# Patient Record
Sex: Male | Born: 1985 | Race: White | Hispanic: No | Marital: Single | Smoking: Current every day smoker
Health system: Southern US, Community
[De-identification: ages and names within clinical notes are randomized; demographics above are authoritative.]

## PROBLEM LIST (undated history)

## (undated) HISTORY — PX: OTHER SURGICAL HISTORY: SHX169

## (undated) HISTORY — PX: HIP PINNING: SHX1757

---
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
## 2007-06-22 ENCOUNTER — Emergency Department (HOSPITAL_COMMUNITY): Admission: EM | Admit: 2007-06-22 | Discharge: 2007-06-22 | Payer: Self-pay | Admitting: Emergency Medicine

## 2008-05-13 ENCOUNTER — Emergency Department: Payer: Self-pay | Admitting: Emergency Medicine

## 2011-01-16 ENCOUNTER — Emergency Department (HOSPITAL_COMMUNITY)
Admission: EM | Admit: 2011-01-16 | Discharge: 2011-01-17 | Disposition: A | Discharge status: Home / Self Care | Payer: Self-pay

## 2011-02-28 ENCOUNTER — Encounter (HOSPITAL_COMMUNITY): Payer: Self-pay | Admitting: *Deleted

## 2011-02-28 ENCOUNTER — Emergency Department (HOSPITAL_COMMUNITY)
Admission: EM | Admit: 2011-02-28 | Discharge: 2011-03-01 | Disposition: A | Discharge status: Home / Self Care | Payer: Self-pay | Attending: Emergency Medicine | Admitting: Emergency Medicine

## 2011-02-28 DIAGNOSIS — R319 Hematuria, unspecified: Secondary | ICD-10-CM | POA: Insufficient documentation

## 2011-02-28 DIAGNOSIS — R300 Dysuria: Secondary | ICD-10-CM | POA: Insufficient documentation

## 2011-02-28 LAB — URINE MICROSCOPIC-ADD ON

## 2011-02-28 LAB — URINALYSIS, ROUTINE W REFLEX MICROSCOPIC
Glucose, UA: NEGATIVE mg/dL
Ketones, ur: 15 mg/dL — AB
pH: 6 (ref 5.0–8.0)

## 2011-02-28 NOTE — ED Provider Notes (Signed)
History     CSN: [id number]  Arrival date & time 02/28/11  2059   First MD Initiated Contact with Patient 02/28/11 2301      Chief Complaint  Patient presents with  . Hematuria    (Consider location/radiation/quality/duration/timing/severity/associated sxs/prior treatment) HPI Comments: Pt presents with cc of hematuria that began today at 8:00 PM. Pt states he has had 3 episodes total and that his urine has been cola colored as well. Pt reports bright red urination the first episode. "I squeezed my penis after i peed and blood continued to leak."  He reports dysuria, but denies frequency, urgency, tenderness and penile discharge. Pt states he is sexually active and uses condoms. Pt denies recent hx of URI or other illness.   Patient is a 26 y.o. male presenting with hematuria. The history is provided by the patient.  Hematuria This is a new problem. The current episode started today (at 8:00PM). The problem has been gradually improving since onset. He describes the hematuria as gross hematuria. The hematuria occurs throughout @his @ entire urinary stream.  He reports no clotting in his urine stream. His pain is at a severity of 6/10 (while urinating, no pain otherwise). Urine color: dark brown, cola colored. Irritative symptoms do not include frequency, nocturia or urgency. Obstructive symptoms do not include dribbling, incomplete emptying, an intermittent stream, a slower stream, straining or a weak stream. Pertinent negatives include no abdominal pain, chills, dysuria, facial swelling, fever, flank pain, genital pain, hesitancy, inability to urinate, nausea or vomiting. He is sexually active (use condoms). His past medical history is significant for tobacco use. There is no history of BPH, GU trauma, hypertension, kidney stones, prostatitis, recent infection, sickle cell disease or STDs.    History reviewed. No pertinent past medical history.  History reviewed. No pertinent past surgical  history.  History reviewed. No pertinent family history.  History  Substance Use Topics  . Smoking status: Current Everyday Smoker -- 1.5 packs/day for 15 years    Types: Cigarettes  . Smokeless tobacco: Not on file  . Alcohol Use: Yes      Review of Systems  Constitutional: Negative for fever and chills.  HENT: Negative for facial swelling.   Gastrointestinal: Negative for nausea, vomiting and abdominal pain.  Genitourinary: Positive for hematuria. Negative for dysuria, hesitancy, urgency, frequency, flank pain, incomplete emptying and nocturia.  All other systems reviewed and are negative.    Allergies  Review of patient's allergies indicates no known allergies.  Home Medications  No current outpatient prescriptions on file.  BP 133/91  Pulse 94  Temp(Src) 98.6 F (37 C) (Oral)  Resp 18  SpO2 99%  Physical Exam  Constitutional: He is oriented to person, place, and time. He appears well-developed and well-nourished. No distress.  HENT:  Head: Normocephalic and atraumatic.  Mouth/Throat: Oropharynx is clear and moist. No oropharyngeal exudate.  Eyes: Conjunctivae and EOM are normal. Pupils are equal, round, and reactive to light. No scleral icterus.  Neck: Normal range of motion. Neck supple. No tracheal deviation present. No thyromegaly present.  Cardiovascular: Normal rate, regular rhythm, normal heart sounds and intact distal pulses.   Pulmonary/Chest: Effort normal and breath sounds normal. No stridor.  Abdominal: Soft. Normal appearance and bowel sounds are normal. There is no tenderness.  Genitourinary: Testes normal and penis normal. Cremasteric reflex is present. Right testis shows no mass and no tenderness. Left testis shows no mass and no tenderness. Circumcised. No penile erythema. No discharge found.  Musculoskeletal: Normal  range of motion. He exhibits no edema and no tenderness.  Neurological: He is alert and oriented to person, place, and time. He has  normal reflexes. Coordination normal.  Skin: Skin is warm and dry. No rash noted. He is not diaphoretic. No erythema. No pallor.  Psychiatric: He has a normal mood and affect. His behavior is normal.    ED Course  Procedures (including critical care time)  Labs Reviewed  URINALYSIS, ROUTINE W REFLEX MICROSCOPIC - Abnormal; Notable for the following:    Color, Urine AMBER (*) BIOCHEMICALS MAY BE AFFECTED BY COLOR   APPearance CLOUDY (*)    Hgb urine dipstick MODERATE (*)    Bilirubin Urine SMALL (*)    Ketones, ur 15 (*)    Protein, ur 30 (*)    All other components within normal limits  URINE MICROSCOPIC-ADD ON - Abnormal; Notable for the following:    Squamous Epithelial / LPF FEW (*)    All other components within normal limits  CBC  DIFFERENTIAL  POCT I-STAT, CHEM 8  I-STAT, CHEM 8  GC/CHLAMYDIA PROBE AMP, GENITAL   No results found.   No diagnosis found.    MDM  Hematuria   Patient is currently hemodynamically stable, in no acute distress, and denies having any pain.  Patient verbalizes understanding that he needs to followup with hematology and that GC/chlamydia cultures are pending. Spoke with radiology and he states there are no acute abnormalities or hydronephrosis seen with in the kidneys. He recommends f-u w urology & possible cystoscopy. In addition pt will be dc on a trial of cipro.       Jaci Carrel, PA-C 03/01/11 0100  [place], PA-C 03/01/11 0101

## 2011-02-28 NOTE — ED Notes (Signed)
Pt reports that when he urinated tonight it was bright red.  Denies pain.  Also reports (L) should pain from previous injury.  No deformity noted.

## 2011-03-01 ENCOUNTER — Emergency Department (HOSPITAL_COMMUNITY): Payer: Self-pay

## 2011-03-01 LAB — CBC
HCT: 45 % (ref 39.0–52.0)
Hemoglobin: 16 g/dL (ref 13.0–17.0)
MCV: 90.4 fL (ref 78.0–100.0)
RBC: 4.98 MIL/uL (ref 4.22–5.81)
WBC: 9.8 10*3/uL (ref 4.0–10.5)

## 2011-03-01 LAB — POCT I-STAT, CHEM 8
Chloride: 100 mEq/L (ref 96–112)
Creatinine, Ser: 1 mg/dL (ref 0.50–1.35)
Glucose, Bld: 88 mg/dL (ref 70–99)
HCT: 48 % (ref 39.0–52.0)
Potassium: 3.7 mEq/L (ref 3.5–5.1)
Sodium: 140 mEq/L (ref 135–145)

## 2011-03-01 LAB — DIFFERENTIAL
Eosinophils Relative: 4 % (ref 0–5)
Lymphocytes Relative: 22 % (ref 12–46)
Lymphs Abs: 2.1 10*3/uL (ref 0.7–4.0)
Monocytes Absolute: 0.9 10*3/uL (ref 0.1–1.0)

## 2011-03-01 MED ORDER — CIPROFLOXACIN HCL 250 MG PO TABS: 250.0000 mg | ORAL_TABLET | Freq: Two times a day (BID) | ORAL | Status: AC

## 2011-03-01 NOTE — ED Notes (Signed)
Pt denies any pain or questions upon discharge. 

## 2011-03-01 NOTE — ED Provider Notes (Signed)
Medical screening examination/treatment/procedure(s) were performed by non-physician practitioner and as supervising physician I was immediately available for consultation/collaboration.  Delaina Fetsch K Damara Klunder-Rasch, MD 03/01/11 0107 

## 2011-03-30 ENCOUNTER — Emergency Department: Payer: Self-pay | Admitting: Emergency Medicine

## 2011-05-17 ENCOUNTER — Emergency Department: Payer: Self-pay

## 2013-02-25 ENCOUNTER — Emergency Department: Payer: Self-pay | Admitting: Emergency Medicine

## 2013-08-21 ENCOUNTER — Emergency Department: Payer: Self-pay | Admitting: Emergency Medicine

## 2013-08-21 LAB — URINALYSIS, COMPLETE
BILIRUBIN, UR: NEGATIVE
Blood: NEGATIVE
Glucose,UR: NEGATIVE mg/dL (ref 0–75)
LEUKOCYTE ESTERASE: NEGATIVE
NITRITE: NEGATIVE
PH: 5 (ref 4.5–8.0)
RBC,UR: 1 /HPF (ref 0–5)
SPECIFIC GRAVITY: 1.03 (ref 1.003–1.030)
WBC UR: 3 /HPF (ref 0–5)

## 2013-08-21 LAB — COMPREHENSIVE METABOLIC PANEL
ALBUMIN: 5.4 g/dL — AB (ref 3.4–5.0)
ALK PHOS: 87 U/L
ANION GAP: 9 (ref 7–16)
BUN: 32 mg/dL — ABNORMAL HIGH (ref 7–18)
Bilirubin,Total: 1.2 mg/dL — ABNORMAL HIGH (ref 0.2–1.0)
CALCIUM: 10.3 mg/dL — AB (ref 8.5–10.1)
CHLORIDE: 95 mmol/L — AB (ref 98–107)
Co2: 27 mmol/L (ref 21–32)
Creatinine: 1.47 mg/dL — ABNORMAL HIGH (ref 0.60–1.30)
EGFR (African American): >60
EGFR (Non-African Amer.): >60
Glucose: 100 mg/dL — ABNORMAL HIGH (ref 65–99)
Osmolality: 270 (ref 275–301)
Potassium: 3.7 mmol/L (ref 3.5–5.1)
SGOT(AST): 18 U/L (ref 15–37)
SGPT (ALT): 20 U/L (ref 12–78)
Sodium: 131 mmol/L — ABNORMAL LOW (ref 136–145)
TOTAL PROTEIN: 8.8 g/dL — AB (ref 6.4–8.2)

## 2013-08-21 LAB — DRUG SCREEN, URINE
AMPHETAMINES, UR SCREEN: NEGATIVE (ref ?–1000)
BARBITURATES, UR SCREEN: NEGATIVE (ref ?–200)
BENZODIAZEPINE, UR SCRN: NEGATIVE (ref ?–200)
Cannabinoid 50 Ng, Ur ~~LOC~~: POSITIVE (ref ?–50)
Cocaine Metabolite,Ur ~~LOC~~: NEGATIVE (ref ?–300)
MDMA (Ecstasy)Ur Screen: NEGATIVE (ref ?–500)
Methadone, Ur Screen: NEGATIVE (ref ?–300)
Opiate, Ur Screen: NEGATIVE (ref ?–300)
PHENCYCLIDINE (PCP) UR S: NEGATIVE (ref ?–25)
Tricyclic, Ur Screen: NEGATIVE (ref ?–1000)

## 2013-08-21 LAB — CK: CK, TOTAL: 355 U/L — AB

## 2013-08-21 LAB — CBC
HCT: 51.6 % (ref 40.0–52.0)
HGB: 17.6 g/dL (ref 13.0–18.0)
MCH: 32.3 pg (ref 26.0–34.0)
MCHC: 34 g/dL (ref 32.0–36.0)
MCV: 95 fL (ref 80–100)
PLATELETS: 214 10*3/uL (ref 150–440)
RBC: 5.43 10*6/uL (ref 4.40–5.90)
RDW: 13.5 % (ref 11.5–14.5)
WBC: 11.3 10*3/uL — AB (ref 3.8–10.6)

## 2013-08-21 LAB — LIPASE, BLOOD: Lipase: 74 U/L (ref 73–393)

## 2013-08-21 LAB — PROTIME-INR
INR: 1
PROTHROMBIN TIME: 13.4 s (ref 11.5–14.7)

## 2013-08-21 LAB — TROPONIN I

## 2018-05-27 ENCOUNTER — Emergency Department (HOSPITAL_COMMUNITY)
Admission: EM | Admit: 2018-05-27 | Discharge: 2018-05-27 | Disposition: A | Discharge status: Home / Self Care | Payer: Self-pay | Attending: Emergency Medicine | Admitting: Emergency Medicine

## 2018-05-27 ENCOUNTER — Other Ambulatory Visit: Payer: Self-pay

## 2018-05-27 DIAGNOSIS — F17210 Nicotine dependence, cigarettes, uncomplicated: Secondary | ICD-10-CM | POA: Insufficient documentation

## 2018-05-27 DIAGNOSIS — S61512A Laceration without foreign body of left wrist, initial encounter: Secondary | ICD-10-CM | POA: Insufficient documentation

## 2018-05-27 DIAGNOSIS — Y93H9 Activity, other involving exterior property and land maintenance, building and construction: Secondary | ICD-10-CM | POA: Insufficient documentation

## 2018-05-27 DIAGNOSIS — W208XXA Other cause of strike by thrown, projected or falling object, initial encounter: Secondary | ICD-10-CM | POA: Insufficient documentation

## 2018-05-27 DIAGNOSIS — Y999 Unspecified external cause status: Secondary | ICD-10-CM | POA: Insufficient documentation

## 2018-05-27 DIAGNOSIS — Z23 Encounter for immunization: Secondary | ICD-10-CM | POA: Insufficient documentation

## 2018-05-27 DIAGNOSIS — Y929 Unspecified place or not applicable: Secondary | ICD-10-CM | POA: Insufficient documentation

## 2018-05-27 MED ORDER — TETANUS-DIPHTH-ACELL PERTUSSIS 5-2.5-18.5 LF-MCG/0.5 IM SUSP
0.5000 mL | Freq: Once | INTRAMUSCULAR | Status: AC
  Administered 2018-05-27: 0.5 mL via INTRAMUSCULAR
  Filled 2018-05-27: qty 0.5

## 2018-05-27 NOTE — ED Provider Notes (Signed)
[facility] EMERGENCY DEPARTMENT Provider Note   CSN: [id number] Arrival date & time: 05/27/18  1537    History   Chief Complaint Chief Complaint  Patient presents with  . Wrist Laceration    HPI Ricardo Rice is a 33 y.o. male.     HPI 33 year old man without significant past medical history presents to the emergency department 30 minutes after he accidentally cut his left wrist when working on a window.  He states that he was holding up the window when it fell and hit a nearby knife that hit his wrist.  Patient has no thoughts of harming himself nor was this an intent to harm himself.  Patient not suicidal and otherwise has been feeling well.  Denies any other injuries.  Does not remember his last tetanus update.  Does not take any regular medication.  States that it bled profusely initially but it is since become hemostatic.  Still has full use of his hand and denies any numbness or tingling of the fingers. No past medical history on file.  There are no active problems to display for this patient.   No past surgical history on file.      Home Medications    Prior to Admission medications   Not on File    Family History No family history on file.  Social History Social History   Tobacco Use  . Smoking status: Current Every Day Smoker    Packs/day: 1.50    Years: 15.00    Pack years: 22.50    Types: Cigarettes  Substance Use Topics  . Alcohol use: Yes  . Drug use: Yes    Frequency: 2.0 times per week    Types: Marijuana     Allergies   Patient has no known allergies.   Review of Systems Review of Systems  Constitutional: Negative for fever.  HENT: Negative for dental problem.   Respiratory: Negative for shortness of breath.   Cardiovascular: Negative for chest pain.  Gastrointestinal: Negative for abdominal pain.  Musculoskeletal: Negative for arthralgias.  Skin: Positive for wound.  Allergic/Immunologic: Negative for  immunocompromised state.  Neurological: Negative for weakness and numbness.  Hematological: Does not bruise/bleed easily.  Psychiatric/Behavioral: Negative for self-injury and suicidal ideas.     Physical Exam Updated Vital Signs There were no vitals taken for this visit.  Physical Exam Vitals signs and nursing note reviewed.  Constitutional:      Appearance: He is well-developed.  HENT:     Head: Normocephalic and atraumatic.  Eyes:     Conjunctiva/sclera: Conjunctivae normal.  Neck:     Musculoskeletal: Neck supple.  Cardiovascular:     Rate and Rhythm: Normal rate and regular rhythm.     Heart sounds: No murmur.  Pulmonary:     Effort: Pulmonary effort is normal. No respiratory distress.     Breath sounds: Normal breath sounds.  Abdominal:     Palpations: Abdomen is soft.     Tenderness: There is no abdominal tenderness.  Musculoskeletal:     Comments: 1 cm horizontal laceration overlying the left wrist radial artery superficial.  No arterial tissue able to be found on inspection.  No pulsating blood flow.  Capillary refill less than 2 seconds in bilateral fingers in all fingers.  5/5 strength in grip as well as finger abduction and extension.  Skin:    General: Skin is warm and dry.  Neurological:     Mental Status: He is alert.  Sensory: No sensory deficit.     Motor: No weakness.  Psychiatric:        Mood and Affect: Mood normal.      ED Treatments / Results  Labs (all labs ordered are listed, but only abnormal results are displayed) Labs Reviewed - No data to display  EKG None  Radiology No results found.  Procedures .Marland Kitchen.Laceration Repair Date/Time: 05/27/2018 4:13 PM Performed by: Ina KickWestphal, Mersadies Petree, MD Authorized by: Ina KickWestphal, Keesha Pellum, MD   Consent:    Consent obtained:  Verbal   Consent given by:  Patient Anesthesia (see MAR for exact dosages):    Anesthesia method:  None Laceration details:    Location:  Shoulder/arm   Shoulder/arm  location:  L lower arm   Length (cm):  1 Repair type:    Repair type:  Simple Exploration:    Wound exploration: wound explored through full range of motion and entire depth of wound probed and visualized     Wound extent: no foreign bodies/material noted, no muscle damage noted, no nerve damage noted, no tendon damage noted and no vascular damage noted     Contaminated: no   Treatment:    Area cleansed with:  Saline   Amount of cleaning:  Standard   Irrigation solution:  Sterile saline   Irrigation method:  Syringe Skin repair:    Repair method:  Tissue adhesive Approximation:    Approximation:  Close Post-procedure details:    Dressing:  Open (no dressing)   Patient tolerance of procedure:  Tolerated well, no immediate complications   (including critical care time)  Medications Ordered in ED Medications - No data to display   Initial Impression / Assessment and Plan / ED Course  I have reviewed the triage vital signs and the nursing notes.  Pertinent labs & imaging results that were available during my care of the patient were reviewed by me and considered in my medical decision making (see chart for details).         Well-appearing hemodynamically stable 33 year old man presents to the emergency department for a laceration as noted above.  Does not appear to be psychotic nor does this appear to be self-injurious behavior.  Story consistent with a laceration.  Superficial and I do not believe that imaging such as an x-ray is needed at this time as I can fully palpate and inspect the entirety of the wound.  Patient does not desire stitches at this time so Dermabond and Steri-Strips were used.  Tetanus updated.  No vascular injury identified and the patient has normal sensation, capillary refill and movement of the left hand so I doubt any kind of neurovascular injury at this time.  Given follow-up instructions for PCP for wound recheck if the patient desires.  Good wound  apposition with the closure. Final Clinical Impressions(s) / ED Diagnoses   Final diagnoses:  Laceration of left wrist, initial encounter    ED Discharge Orders    None       Ina KickWestphal, Keela Rubert, MD 05/27/18 1634    Melene PlanFloyd, Dan, DO 05/27/18 1637

## 2018-05-27 NOTE — ED Notes (Signed)
EDP applying steri-strips at bedside.

## 2018-05-27 NOTE — ED Triage Notes (Signed)
Pt was cleaning a window to reset the window & the knife he was holding slipped & cut his inner Lt wrist. approx 1/2 inch in length, edges are clean cut & no bleeding at this time.

## 2019-01-07 ENCOUNTER — Emergency Department (HOSPITAL_COMMUNITY): Payer: Self-pay

## 2019-01-07 ENCOUNTER — Emergency Department (HOSPITAL_COMMUNITY)
Admission: EM | Admit: 2019-01-07 | Discharge: 2019-01-07 | Disposition: A | Discharge status: Home / Self Care | Payer: Self-pay | Attending: Emergency Medicine | Admitting: Emergency Medicine

## 2019-01-07 ENCOUNTER — Encounter (HOSPITAL_COMMUNITY): Payer: Self-pay | Admitting: Emergency Medicine

## 2019-01-07 DIAGNOSIS — L03314 Cellulitis of groin: Secondary | ICD-10-CM | POA: Insufficient documentation

## 2019-01-07 DIAGNOSIS — F17210 Nicotine dependence, cigarettes, uncomplicated: Secondary | ICD-10-CM | POA: Insufficient documentation

## 2019-01-07 LAB — BASIC METABOLIC PANEL
Anion gap: 10 (ref 5–15)
BUN: 9 mg/dL (ref 6–20)
CO2: 26 mmol/L (ref 22–32)
Calcium: 9.3 mg/dL (ref 8.9–10.3)
Chloride: 101 mmol/L (ref 98–111)
Creatinine, Ser: 0.86 mg/dL (ref 0.61–1.24)
GFR calc Af Amer: >60 mL/min (ref >60)
GFR calc non Af Amer: >60 mL/min (ref >60)
Glucose, Bld: 109 mg/dL — ABNORMAL HIGH (ref 70–99)
Potassium: 3.8 mmol/L (ref 3.5–5.1)
Sodium: 137 mmol/L (ref 135–145)

## 2019-01-07 LAB — CBC WITH DIFFERENTIAL/PLATELET
Abs Immature Granulocytes: 0.04 10*3/uL (ref 0.00–0.07)
Basophils Absolute: 0 10*3/uL (ref 0.0–0.1)
Basophils Relative: 0 %
Eosinophils Absolute: 0.2 10*3/uL (ref 0.0–0.5)
Eosinophils Relative: 1 %
HCT: 45 % (ref 39.0–52.0)
Hemoglobin: 15.4 g/dL (ref 13.0–17.0)
Immature Granulocytes: 0 %
Lymphocytes Relative: 13 %
Lymphs Abs: 1.8 10*3/uL (ref 0.7–4.0)
MCH: 31.8 pg (ref 26.0–34.0)
MCHC: 34.2 g/dL (ref 30.0–36.0)
MCV: 92.8 fL (ref 80.0–100.0)
Monocytes Absolute: 0.9 10*3/uL (ref 0.1–1.0)
Monocytes Relative: 6 %
Neutro Abs: 11.2 10*3/uL — ABNORMAL HIGH (ref 1.7–7.7)
Neutrophils Relative %: 80 %
Platelets: 228 10*3/uL (ref 150–400)
RBC: 4.85 MIL/uL (ref 4.22–5.81)
RDW: 12.3 % (ref 11.5–15.5)
WBC: 14.1 10*3/uL — ABNORMAL HIGH (ref 4.0–10.5)
nRBC: 0 % (ref 0.0–0.2)

## 2019-01-07 MED ORDER — SULFAMETHOXAZOLE-TRIMETHOPRIM 800-160 MG PO TABS: 1.0000 | ORAL_TABLET | Freq: Two times a day (BID) | ORAL | 0 refills | Status: AC

## 2019-01-07 MED ORDER — LIDOCAINE-EPINEPHRINE (PF) 2 %-1:200000 IJ SOLN: 10.0000 mL | Freq: Once | INTRAMUSCULAR | Status: DC

## 2019-01-07 MED ORDER — HYDROMORPHONE HCL 1 MG/ML IJ SOLN
1.0000 mg | Freq: Once | INTRAMUSCULAR | Status: AC
  Administered 2019-01-07: 19:00:00 1 mg via INTRAVENOUS
  Filled 2019-01-07: qty 1

## 2019-01-07 MED ORDER — SODIUM CHLORIDE 0.9 % IV SOLN
INTRAVENOUS | Status: DC
  Administered 2019-01-07: 20:00:00 via INTRAVENOUS

## 2019-01-07 MED ORDER — CEPHALEXIN 500 MG PO CAPS: 500.0000 mg | ORAL_CAPSULE | Freq: Three times a day (TID) | ORAL | 0 refills | Status: AC

## 2019-01-07 MED ORDER — HYDROCODONE-ACETAMINOPHEN 5-325 MG PO TABS
1.0000 | ORAL_TABLET | Freq: Four times a day (QID) | ORAL | 0 refills | Status: AC | PRN
Start: 2019-01-07 — End: ?

## 2019-01-07 MED ORDER — KETOROLAC TROMETHAMINE 15 MG/ML IJ SOLN
15.0000 mg | Freq: Once | INTRAMUSCULAR | Status: AC
  Administered 2019-01-07: 19:00:00 15 mg via INTRAVENOUS
  Filled 2019-01-07: qty 1

## 2019-01-07 MED ORDER — VANCOMYCIN HCL 10 G IV SOLR
1500.0000 mg | Freq: Once | INTRAVENOUS | Status: AC
  Administered 2019-01-07: 20:00:00 1500 mg via INTRAVENOUS
  Filled 2019-01-07: qty 1500

## 2019-01-07 MED ORDER — IOHEXOL 300 MG/ML  SOLN
100.0000 mL | Freq: Once | INTRAMUSCULAR | Status: AC | PRN
  Administered 2019-01-07: 20:00:00 100 mL via INTRAVENOUS

## 2019-01-07 MED ORDER — LIDOCAINE-EPINEPHRINE 2 %-1:100000 IJ SOLN: 10.0000 mL | Freq: Once | INTRAMUSCULAR | Status: DC

## 2019-01-07 NOTE — Discharge Instructions (Signed)
You have cellulitis at this point. There is not a well organized collection (abscess) to drain currently. Take antibiotics as prescribed. I want you to use warm compresses or sitz baths 2-3 times/day. Do not pick or probe at the area. If you feel like symptoms are worsening or not starting to get better in the next 48 hours then I want you to be rechecked. Take ibuprofen 600 mg every 6 hours as needed for pain. Use vicodin as needed for break through pain.

## 2019-01-07 NOTE — ED Notes (Signed)
Pt was in hurry and wouldn't let me get vs

## 2019-01-07 NOTE — ED Notes (Signed)
Patient transported to CT 

## 2019-01-07 NOTE — ED Triage Notes (Signed)
Pt states he has an ingrown hair in right groin that he "cut out last week" but it came back.

## 2019-01-11 NOTE — ED Provider Notes (Signed)
[facility] EMERGENCY DEPARTMENT Provider Note   CSN: [id number] Arrival date & time: 01/07/19  1541     History   Chief Complaint Chief Complaint  Patient presents with  . Recurrent Skin Infections    HPI Ricardo Rice is a 33 y.o. male.     HPI   33 year old male with painful lesion in his right groin.  Patient reports a "ingrown hair" that he "cut out" in the last week.  It seemed to improve initially but then he began having worsening pain and swelling over the past day or so.  No drainage.  No fevers or chills.  History reviewed. No pertinent past medical history.  There are no active problems to display for this patient.   No past surgical history on file.      Home Medications    Prior to Admission medications   Medication Sig Start Date End Date Taking? Authorizing Provider  cephALEXin (KEFLEX) 500 MG capsule Take 1 capsule (500 mg total) by mouth 3 (three) times daily. 01/07/19   Raeford Razor, MD  HYDROcodone-acetaminophen (NORCO/VICODIN) 5-325 MG tablet Take 1 tablet by mouth every 6 (six) hours as needed. 01/07/19   Raeford Razor, MD  sulfamethoxazole-trimethoprim (BACTRIM DS) 800-160 MG tablet Take 1 tablet by mouth 2 (two) times daily for 7 days. 01/07/19 01/14/19  Raeford Razor, MD    Family History No family history on file.  Social History Social History   Tobacco Use  . Smoking status: Current Every Day Smoker    Packs/day: 1.50    Years: 15.00    Pack years: 22.50    Types: Cigarettes  Substance Use Topics  . Alcohol use: Yes  . Drug use: Yes    Frequency: 2.0 times per week    Types: Marijuana     Allergies   Patient has no known allergies.   Review of Systems Review of Systems  All systems reviewed and negative, other than as noted in HPI.  Physical Exam Updated Vital Signs BP 128/85   Pulse (!) 121   Temp 99.6 F (37.6 C) (Oral)   Resp 18   Wt 68 kg   SpO2 99%   BMI 22.81 kg/m   Physical  Exam Vitals signs and nursing note reviewed.  Constitutional:      General: He is not in acute distress.    Appearance: He is well-developed.  HENT:     Head: Normocephalic and atraumatic.  Eyes:     General:        Right eye: No discharge.        Left eye: No discharge.     Conjunctiva/sclera: Conjunctivae normal.  Neck:     Musculoskeletal: Neck supple.  Cardiovascular:     Rate and Rhythm: Normal rate and regular rhythm.     Heart sounds: Normal heart sounds. No murmur. No friction rub. No gallop.   Pulmonary:     Effort: Pulmonary effort is normal. No respiratory distress.     Breath sounds: Normal breath sounds.  Abdominal:     General: There is no distension.     Palpations: Abdomen is soft.     Tenderness: There is abdominal tenderness.     Comments: Tender, erythematous lesion noted near the right inguinal crease.  Very indurated.  No fluctuance appreciated.  Bedside ultrasound showed subcutaneous cobblestoning but no discrete collection noted.  Not extend into the scrotum/perineum.  Musculoskeletal:        General: No tenderness.  Skin:    General: Skin is warm and dry.  Neurological:     Mental Status: He is alert.  Psychiatric:        Behavior: Behavior normal.        Thought Content: Thought content normal.      ED Treatments / Results  Labs (all labs ordered are listed, but only abnormal results are displayed) Labs Reviewed  CBC WITH DIFFERENTIAL/PLATELET - Abnormal; Notable for the following components:      Result Value   WBC 14.1 (*)    Neutro Abs 11.2 (*)    All other components within normal limits  BASIC METABOLIC PANEL - Abnormal; Notable for the following components:   Glucose, Bld 109 (*)    All other components within normal limits    EKG None  Radiology No results found.  Procedures Procedures (including critical care time)  Medications Ordered in ED Medications  vancomycin (VANCOCIN) 1,500 mg in sodium chloride 0.9 % 500 mL IVPB  (0 mg Intravenous Stopped 01/07/19 2147)  HYDROmorphone (DILAUDID) injection 1 mg (1 mg Intravenous Given 01/07/19 1858)  ketorolac (TORADOL) 15 MG/ML injection 15 mg (15 mg Intravenous Given 01/07/19 1858)  iohexol (OMNIPAQUE) 300 MG/ML solution 100 mL (100 mLs Intravenous Contrast Given 01/07/19 1939)     Initial Impression / Assessment and Plan / ED Course  I have reviewed the triage vital signs and the nursing notes.  Pertinent labs & imaging results that were available during my care of the patient were reviewed by me and considered in my medical decision making (see chart for details).        33 year old male with what appears to be a cellulitis of the right groin at this time.  No discrete abscess to drain.  Sitz bath/warm compresses.  Antibiotics.  Return precautions were discussed.  Final Clinical Impressions(s) / ED Diagnoses   Final diagnoses:  Cellulitis of groin    ED Discharge Orders         Ordered    cephALEXin (KEFLEX) 500 MG capsule  3 times daily     01/07/19 2035    sulfamethoxazole-trimethoprim (BACTRIM DS) 800-160 MG tablet  2 times daily     01/07/19 2035    HYDROcodone-acetaminophen (NORCO/VICODIN) 5-325 MG tablet  Every 6 hours PRN     01/07/19 2036           Virgel Manifold, MD 01/11/19 2351

## 2021-04-29 ENCOUNTER — Emergency Department (HOSPITAL_COMMUNITY)
Admission: EM | Admit: 2021-04-29 | Discharge: 2021-04-29 | Disposition: A | Discharge status: Home / Self Care | Payer: Managed Care, Other (non HMO) | Attending: Emergency Medicine | Admitting: Emergency Medicine

## 2021-04-29 ENCOUNTER — Encounter (HOSPITAL_COMMUNITY): Payer: Self-pay | Admitting: *Deleted

## 2021-04-29 ENCOUNTER — Emergency Department (HOSPITAL_COMMUNITY): Payer: Managed Care, Other (non HMO)

## 2021-04-29 ENCOUNTER — Other Ambulatory Visit: Payer: Self-pay

## 2021-04-29 DIAGNOSIS — S61431A Puncture wound without foreign body of right hand, initial encounter: Secondary | ICD-10-CM | POA: Diagnosis not present

## 2021-04-29 DIAGNOSIS — S61031A Puncture wound without foreign body of right thumb without damage to nail, initial encounter: Secondary | ICD-10-CM | POA: Insufficient documentation

## 2021-04-29 DIAGNOSIS — W540XXA Bitten by dog, initial encounter: Secondary | ICD-10-CM | POA: Insufficient documentation

## 2021-04-29 DIAGNOSIS — Z203 Contact with and (suspected) exposure to rabies: Secondary | ICD-10-CM | POA: Insufficient documentation

## 2021-04-29 DIAGNOSIS — Y92009 Unspecified place in unspecified non-institutional (private) residence as the place of occurrence of the external cause: Secondary | ICD-10-CM | POA: Diagnosis not present

## 2021-04-29 DIAGNOSIS — S6991XA Unspecified injury of right wrist, hand and finger(s), initial encounter: Secondary | ICD-10-CM | POA: Diagnosis present

## 2021-04-29 DIAGNOSIS — S61531A Puncture wound without foreign body of right wrist, initial encounter: Secondary | ICD-10-CM | POA: Insufficient documentation

## 2021-04-29 MED ORDER — IBUPROFEN 800 MG PO TABS
800.0000 mg | ORAL_TABLET | Freq: Once | ORAL | Status: AC
  Administered 2021-04-29: 800 mg via ORAL
  Filled 2021-04-29: qty 1

## 2021-04-29 MED ORDER — FENTANYL CITRATE PF 50 MCG/ML IJ SOSY: 25.0000 ug | PREFILLED_SYRINGE | Freq: Once | INTRAMUSCULAR | Status: DC

## 2021-04-29 MED ORDER — AMOXICILLIN-POT CLAVULANATE 875-125 MG PO TABS
1.0000 | ORAL_TABLET | Freq: Once | ORAL | Status: AC
  Administered 2021-04-29: 1 via ORAL
  Filled 2021-04-29: qty 1

## 2021-04-29 MED ORDER — FENTANYL CITRATE PF 50 MCG/ML IJ SOSY
25.0000 ug | PREFILLED_SYRINGE | Freq: Once | INTRAMUSCULAR | Status: AC
  Administered 2021-04-29: 25 ug via INTRAVENOUS
  Filled 2021-04-29: qty 1

## 2021-04-29 MED ORDER — AMOXICILLIN-POT CLAVULANATE 875-125 MG PO TABS
1.0000 | ORAL_TABLET | Freq: Two times a day (BID) | ORAL | 0 refills | Status: AC
Start: 2021-04-29 — End: ?

## 2021-04-29 NOTE — ED Triage Notes (Signed)
Patient presents to [facility] states he was at home his 2 male pit bull dogs got into a fight and he was trying to break them up and one of his dogs bit his right hand. Per ems 4-5 puncture wounds to his right hand. Bleeding controled at present. Patient states dogs shots are up to date.  ?

## 2021-04-29 NOTE — ED Provider Notes (Signed)
?[facility] EMERGENCY DEPARTMENT ?Provider Note ? ? ?CSN: [id number] ?Arrival date & time: 04/29/21  1044 ? ?  ? ?History ? ?Chief Complaint  ?Patient presents with  ? Animal Bite  ? ? ?Ricardo Rice is a 36 y.o. male presenting today after dog bite.  Reports he was playing with his 2 pit bulls when one of them bit his right hand.  Says they are up-to-date on vaccinations that have been vaccinated for rabies.  Denies numbness or tingling but endorsing severe pain.  Is unsure when his last tetanus shot was.  No antibiotic allergies.  He reports full range of motion and that bleeding was easily controlled with pressure. ? ? ?Animal Bite ?Contact animal:  Dog ?Location:  Hand ?Hand injury location:  R hand ?Time since incident:  1 hour ?Pain details:  ?  Quality:  Unable to specify ?  Severity:  Severe ?  Timing:  Constant ?Incident location:  Home ?Provoked: provoked   ?Animal's rabies vaccination status:  Up to date ?Animal in possession: yes   ?Tetanus status:  Out of date ?Worsened by:  Activity ?Ineffective treatments:  None tried ?Associated symptoms: no numbness   ? ?  ? ?Home Medications ?Prior to Admission medications   ?Medication Sig Start Date End Date Taking? Authorizing Provider  ?amoxicillin-clavulanate (AUGMENTIN) 875-125 MG tablet Take 1 tablet by mouth every 12 (twelve) hours. 04/29/21  Yes Crystalmarie Yasin A, PA-C  ?cephALEXin (KEFLEX) 500 MG capsule Take 1 capsule (500 mg total) by mouth 3 (three) times daily. 01/07/19   Raeford Razor, MD  ?HYDROcodone-acetaminophen (NORCO/VICODIN) 5-325 MG tablet Take 1 tablet by mouth every 6 (six) hours as needed. 01/07/19   Raeford Razor, MD  ?   ? ?Allergies    ?Patient has no known allergies.   ? ?Review of Systems   ?Review of Systems  ?Neurological:  Negative for numbness.  ?See HPI ? ?Physical Exam ?Updated Vital Signs ?BP 127/79 (BP Location: Left Arm)   Pulse 64   Temp 98.5 ?F (36.9 ?C) (Oral)   Resp 17   Ht 5\' 5"  (1.651 m)   Wt  68 kg   SpO2 99%   BMI 24.96 kg/m?  ?Physical Exam ?Vitals and nursing note reviewed.  ?Constitutional:   ?   Appearance: Normal appearance.  ?HENT:  ?   Head: Normocephalic and atraumatic.  ?Eyes:  ?   General: No scleral icterus. ?   Conjunctiva/sclera: Conjunctivae normal.  ?Pulmonary:  ?   Effort: Pulmonary effort is normal. No respiratory distress.  ?Musculoskeletal:     ?   General: Signs of injury present. Normal range of motion.  ?   Comments: Full range of motion, no exposed muscles, tendons or vasculature.  Photo of wound in chart.  Puncture wounds to dorsal right hand and wrist  ?Skin: ?   Findings: No rash.  ?Neurological:  ?   Mental Status: He is alert.  ?Psychiatric:     ?   Mood and Affect: Mood normal.  ? ? ?ED Results / Procedures / Treatments   ?Labs ?(all labs ordered are listed, but only abnormal results are displayed) ?Labs Reviewed - No data to display ? ?EKG ?None ? ?Radiology ?DG Hand Complete Right ? ?Result Date: 04/29/2021 ?CLINICAL DATA:  Dog bite near base of thumb EXAM: RIGHT HAND - COMPLETE 3+ VIEW COMPARISON:  None. FINDINGS: There is no evidence of fracture or dislocation. There is no evidence of arthropathy or other focal bone abnormality.  No radiopaque foreign body. Soft tissue lacerations about the base of the thumb. IMPRESSION: No fracture or dislocation of the right hand. No radiopaque foreign body. Electronically Signed   By: Jearld Lesch M.D.   On: 04/29/2021 12:17   ? ?Procedures ?Procedures  ? ?Medications Ordered in ED ?Medications  ?fentaNYL (SUBLIMAZE) injection 25 mcg (has no administration in time range)  ? ? ?ED Course/ Medical Decision Making/ A&P ?Clinical Course as of 04/29/21 1159  ?Fri Apr 29, 2021  ?1155 This is a 36 year old male presenting emergency department with a dog bite to his right hand.  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  He reports that the dog belongs to a family member, the vaccines are up-to-date.  He  has several punctate lacerations to the dorsum of his right hand, involving the base of the thumb.  They are not actively bleeding.  He has full range of motion of all digits, including thumb opposition, and has no evidence of neurovascular damage on my exam.  Sensation is intact.  I do not see evidence of infection or abscess at this time.  He will need an x-ray to screen for retained foreign bodies, will need the wounds to be irrigated, I would leave the wounds open as they are punctate we do not want to seal them at this time.  He will need to be started on Augmentin as antibiotics.  Patient had refused tetanus here. [MT]  ?  ?Clinical Course User Index ?[MT] Terald Sleeper, MD  ? ?                        ?Medical Decision Making ?Amount and/or Complexity of Data Reviewed ?Radiology: ordered. ? ?Risk ?Prescription drug management. ? ? ?36 year old male presenting today with the complaint of a dog bite.  Dog's were his, up-to-date on vaccinations.  He does not know when his last tetanus shot was but refuses to have one in the department today. ? ?Patient was also seen by my attending, Dr. Renaye Rakers.  Radiograph was ordered and independently viewed by me.  No signs of bone involvement.  Radiologist states that there is also no foreign body. ? ?Patient is neurovascularly intact.  Full range of motion in wrist, all MTPs and phalanges.  He will be discharged home with wound care and antibiotics.  He continues to deny tetanus shot.  We discussed the risks of this and he says he understands.  Reports fentanyl helped his pain.  He will treat with over-the-counter medications at home. ? ?Final Clinical Impression(s) / ED Diagnoses ?Final diagnoses:  ?Dog bite, initial encounter  ? ? ?Rx / DC Orders ?ED Discharge Orders   ? ?      Ordered  ?  amoxicillin-clavulanate (AUGMENTIN) 875-125 MG tablet  Every 12 hours       ? 04/29/21 1118  ? ?  ?  ? ?  ? ?Results and diagnoses were explained to the patient. Return precautions  discussed in full. Patient had no additional questions and expressed complete understanding. ? ? ?This chart was dictated using voice recognition software.  Despite best efforts to proofread,  errors can occur which can change the documentation meaning.  ?  ?Saddie Benders, PA-C ?04/29/21 1242 ? ?  ?Terald Sleeper, MD ?04/29/21 1333 ? ?

## 2021-04-29 NOTE — Discharge Instructions (Addendum)
Please return if you begin to have fevers, chills or notice yellow/green discharge from the wound.  Be sure to keep it clean with mild soap and warm water and keep the area covered for the next few days.  You may use Neosporin on the areas as you wish. ?

## 2021-05-26 IMAGING — CT CT PELVIS W/ CM
2 of 3 series · 16 of 46 positions shown, 18 images · IV contrast (Omni 300)
Comparison: None.

CLINICAL DATA: Pelvic swelling on the right, history of recent
ingrown hair

EXAM:
CT PELVIS WITH CONTRAST
TECHNIQUE: Multidetector CT imaging of the pelvis was performed using the
standard protocol following the bolus administration of intravenous
contrast.
CONTRAST:  100mL OMNIPAQUE IOHEXOL 300 MG/ML  SOLN

[Series 3: pelvis with 5.0 · axial · 0.73mm/px · z∈[+707,+997]mm · 13 of 68 slices shown, 15 images]
[im 5/68  soft-tissue]
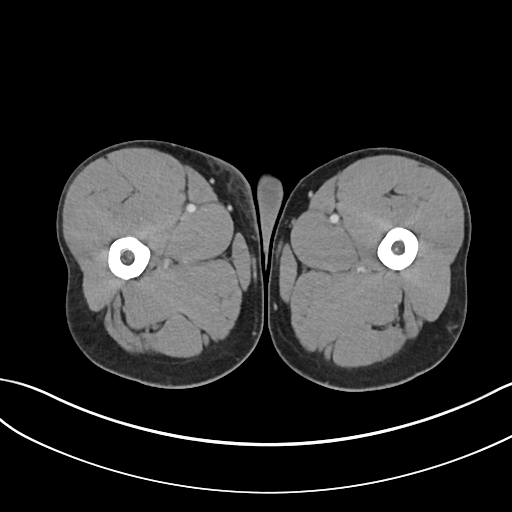
[im 5/68  bone]
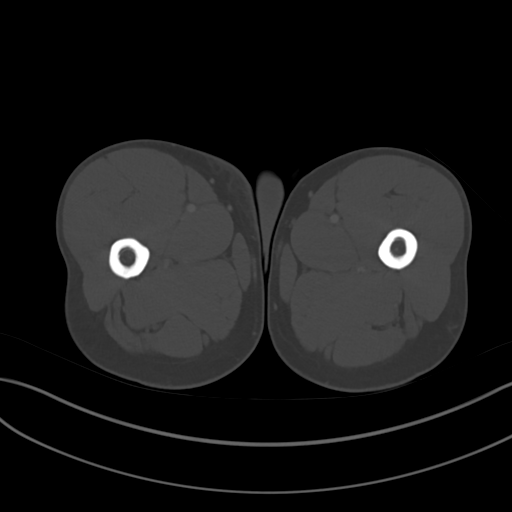
[im 9/68  soft-tissue]
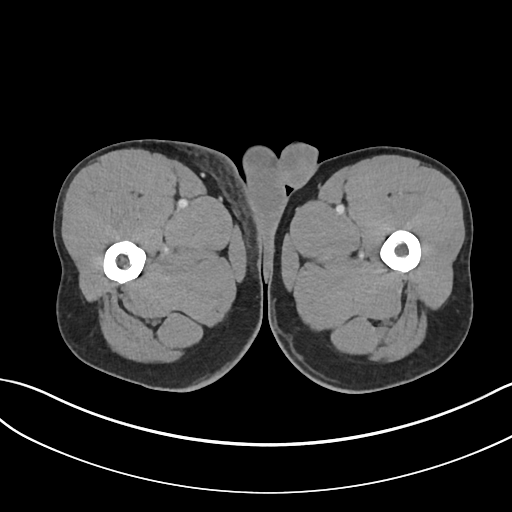
[im 13/68  soft-tissue]
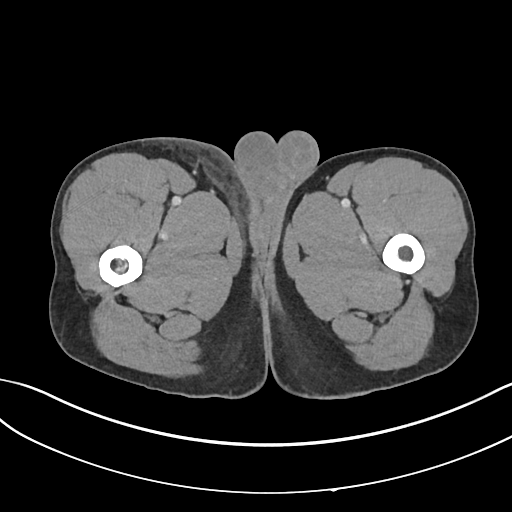
[im 20/68  soft-tissue]
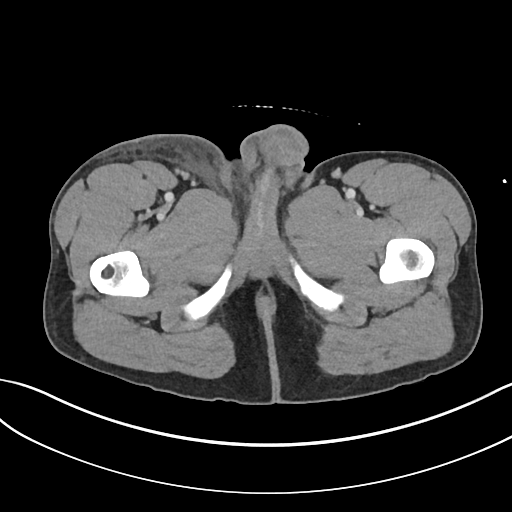
[im 24/68  soft-tissue]
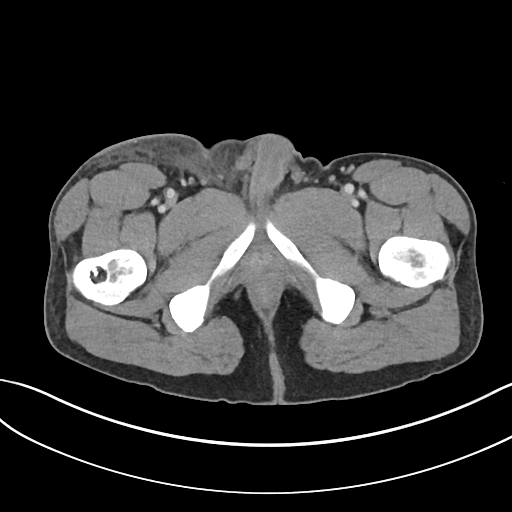
[im 29/68  soft-tissue]
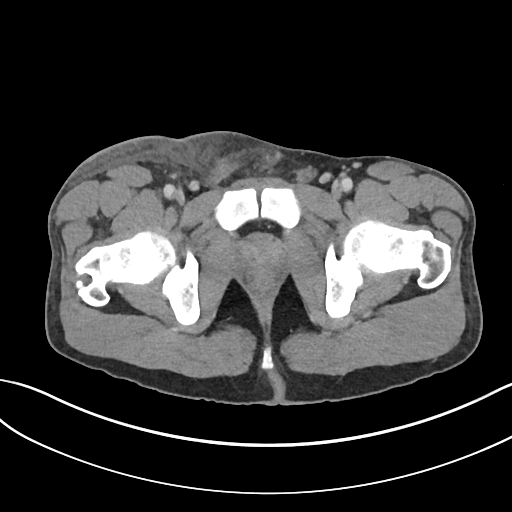
[im 35/68  soft-tissue]
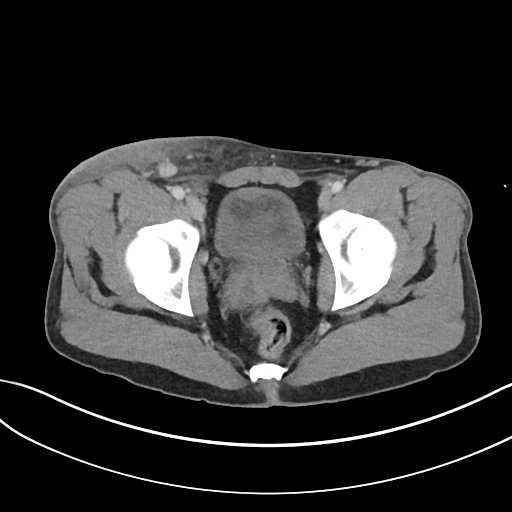
[im 39/68  soft-tissue]
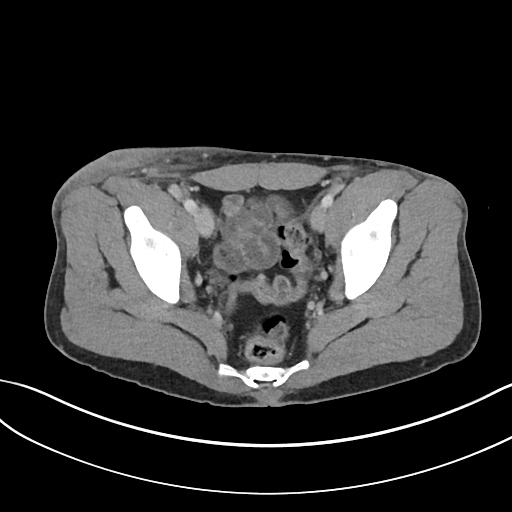
[im 44/68  soft-tissue]
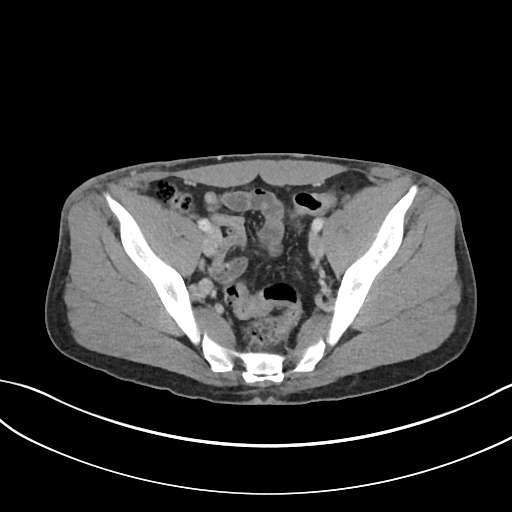
[im 44/68  bone]
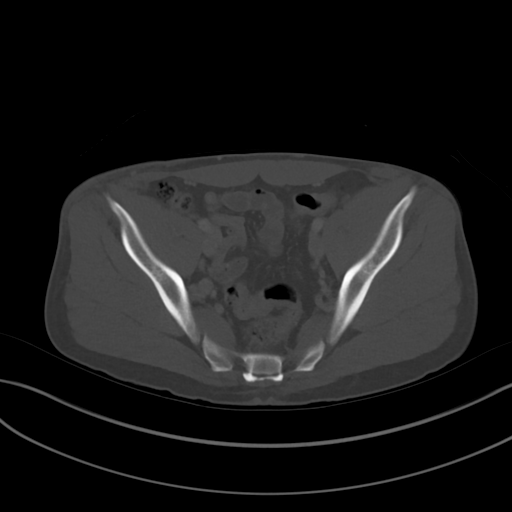
[im 48/68  soft-tissue]
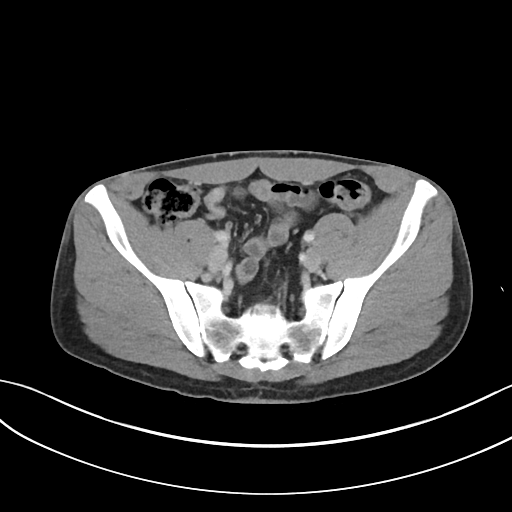
[im 55/68  soft-tissue]
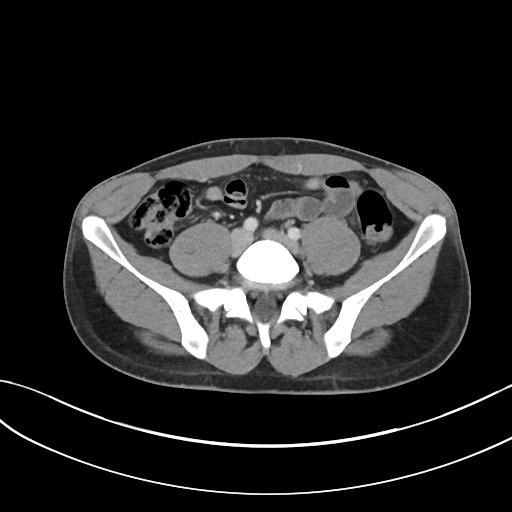
[im 59/68  soft-tissue]
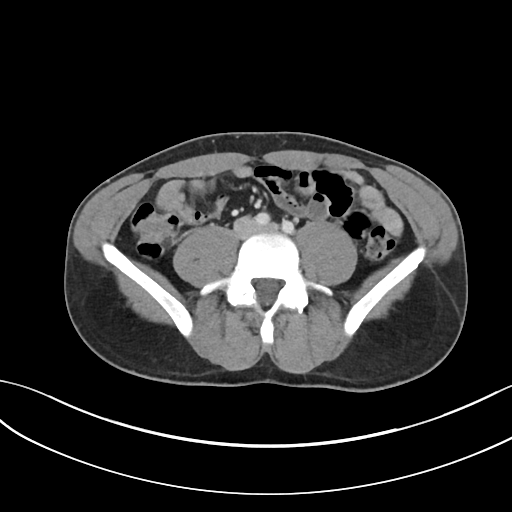
[im 63/68  soft-tissue]
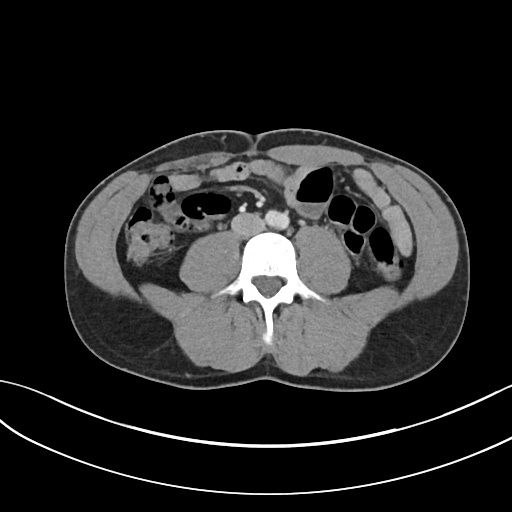

[Series 5: pelvis with 2.0 cor · coronal · 0.67mm/px · 3 of 121 slices shown]
[im 41/121  soft-tissue]
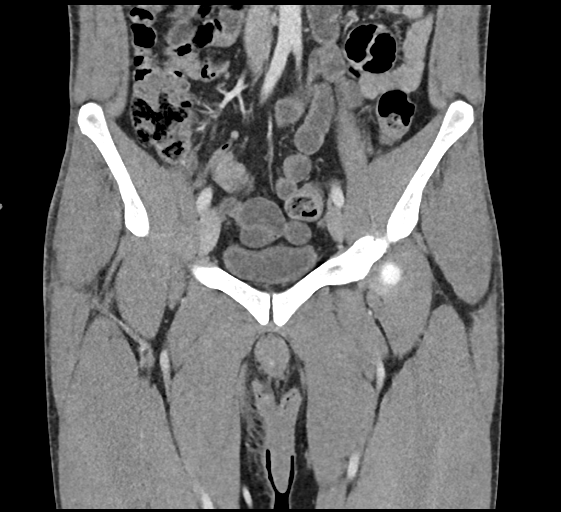
[im 54/121  soft-tissue]
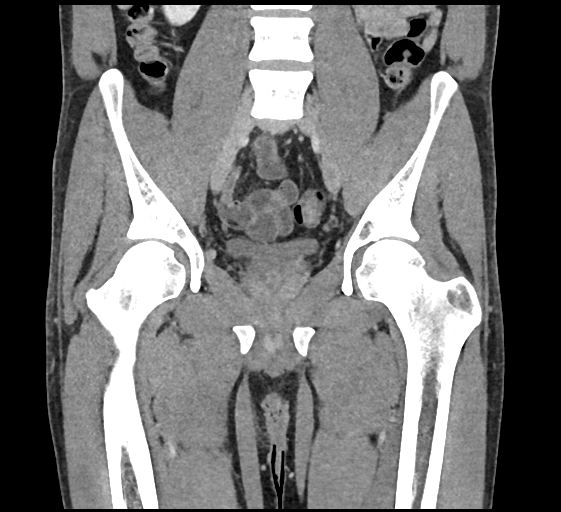
[im 67/121  soft-tissue]
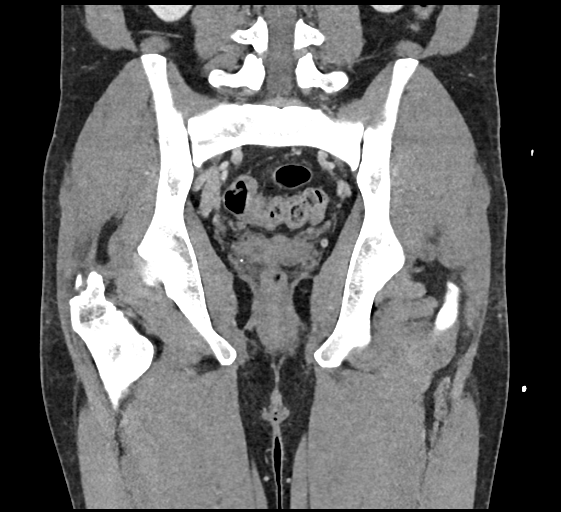

[16 of 46 positions shown; findings below may reference images not displayed]

FINDINGS: Urinary Tract: Visualized portions of the kidneys are within normal
limits. The ureters and bladder are within normal limits.

Bowel: Visualized colon and small bowel are unremarkable. The
appendix is within normal limits.

Vascular/Lymphatic: Atherosclerotic calcifications of the aorta and
iliac vessels are seen. Scattered enhancing right inguinal lymph
nodes are noted likely reactive in nature. The largest of these
measures 10 mm in short axis. There slightly larger than those seen
on the left.

Reproductive:  Prostate is within normal limits.

Other: Considerable soft tissue swelling is noted in the right
inguinal region consistent with the given clinical history of
cellulitis. A tiny focus of fluid is noted on the right best seen on
image number 30 of series 3 which measures approximately 1 cm in
greatest dimension and may represent a tiny abscess. No other
sizable fluid collection is noted.

Musculoskeletal: Postsurgical changes in the proximal right femur
are noted. No other bony abnormality is noted
IMPRESSION: Considerable subcutaneous edema with reactive lymph nodes in the
right inguinal region and extending along the medial aspect of the
thigh and lateral hip. A tiny focus of fluid measuring 1 cm is noted
as described which may represent an early abscess. No other sizable
fluid collections are noted.

## 2023-09-16 IMAGING — CR DG HAND COMPLETE 3+V*R*
3 series · 3 of 3 positions shown · non-contrast
Comparison: None.

CLINICAL DATA: Dog bite near base of thumb

EXAM:
RIGHT HAND - COMPLETE 3+ VIEW

[hand pa]
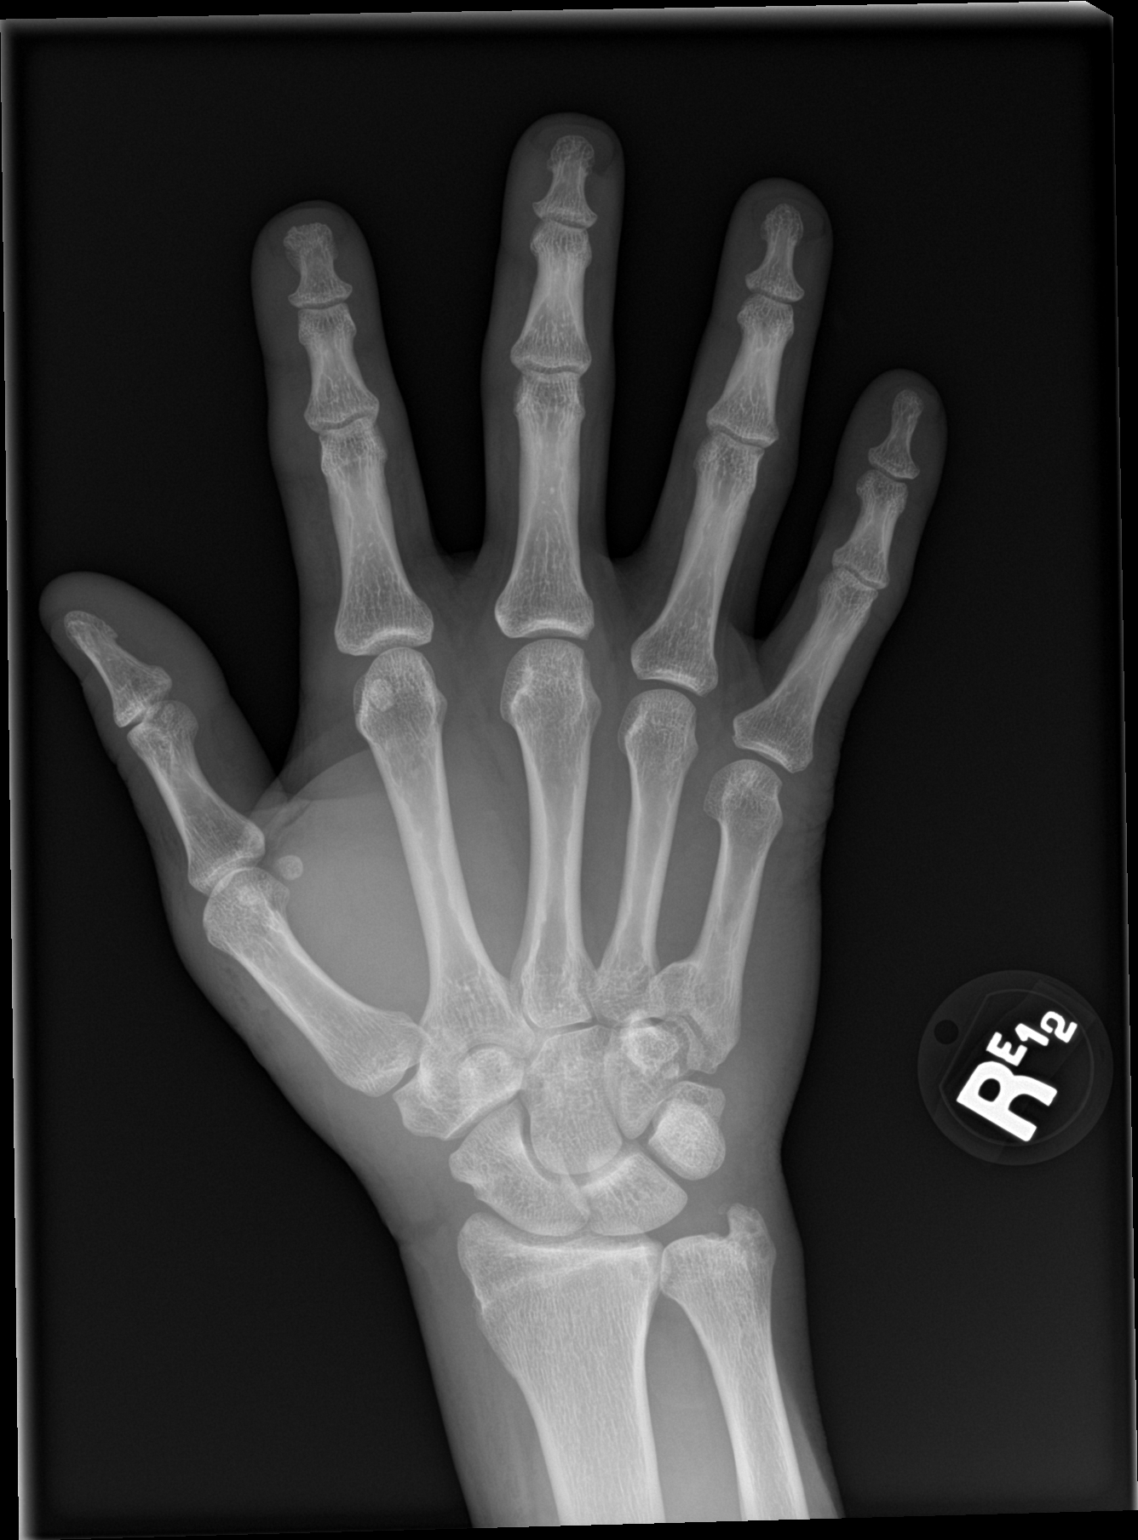

[hand obl]
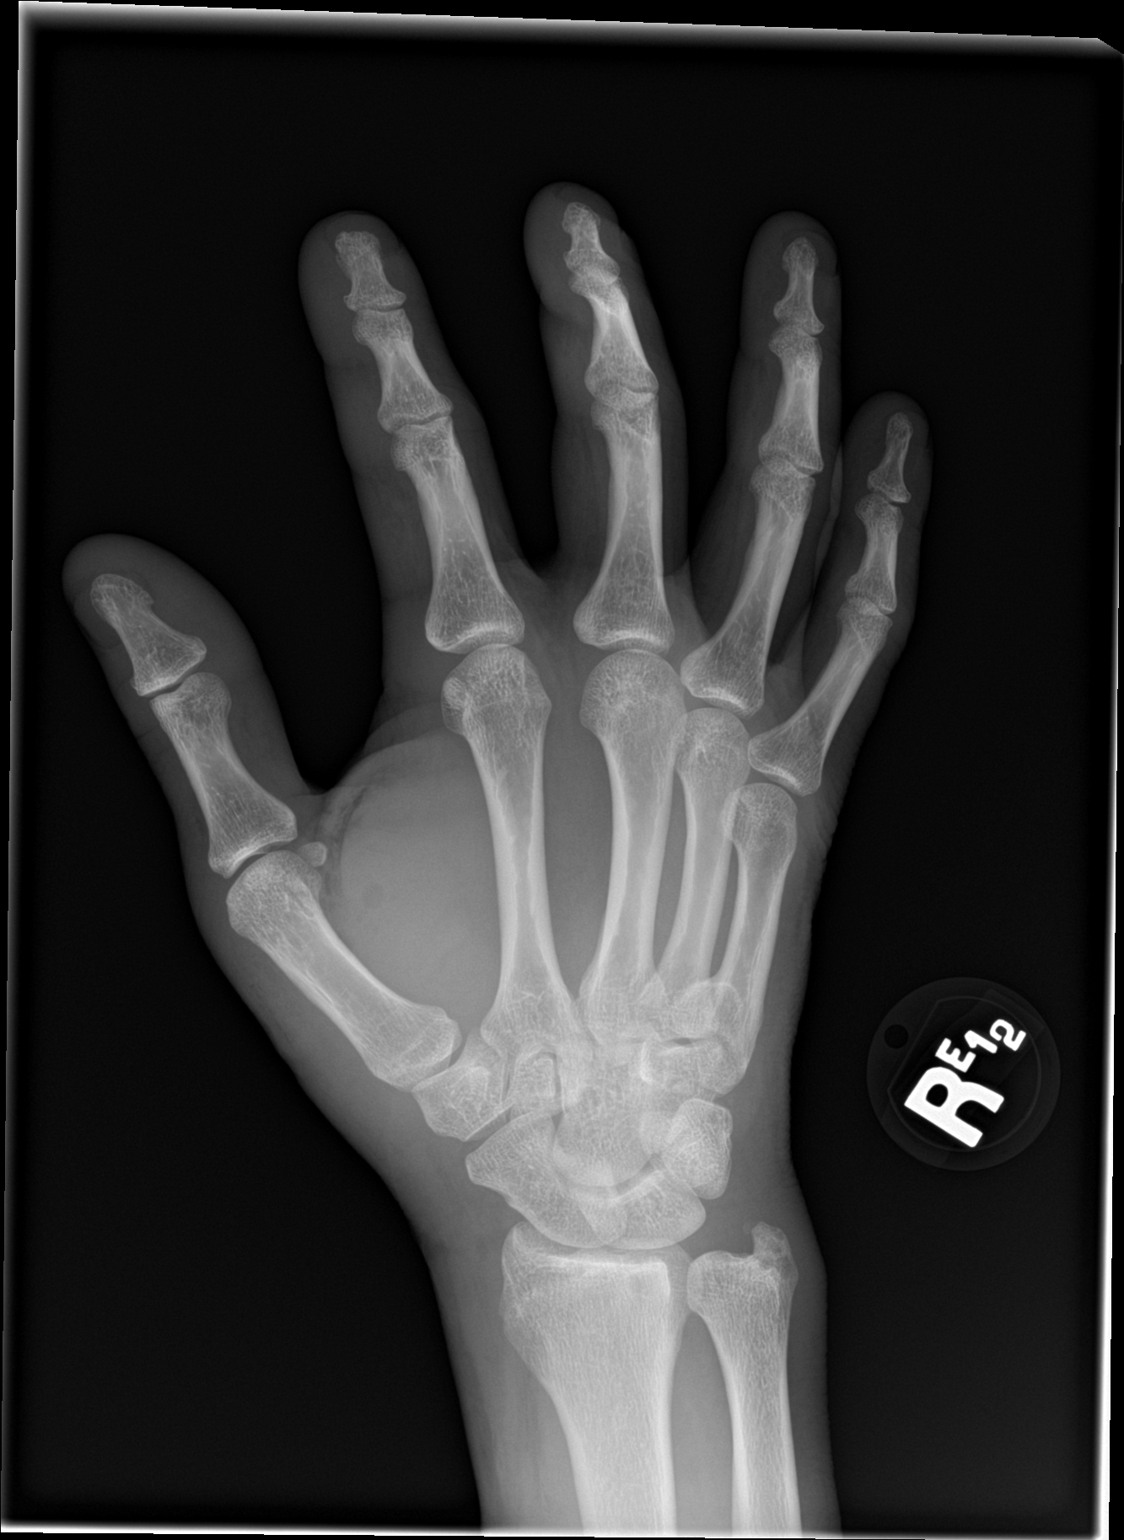

[hand lat]
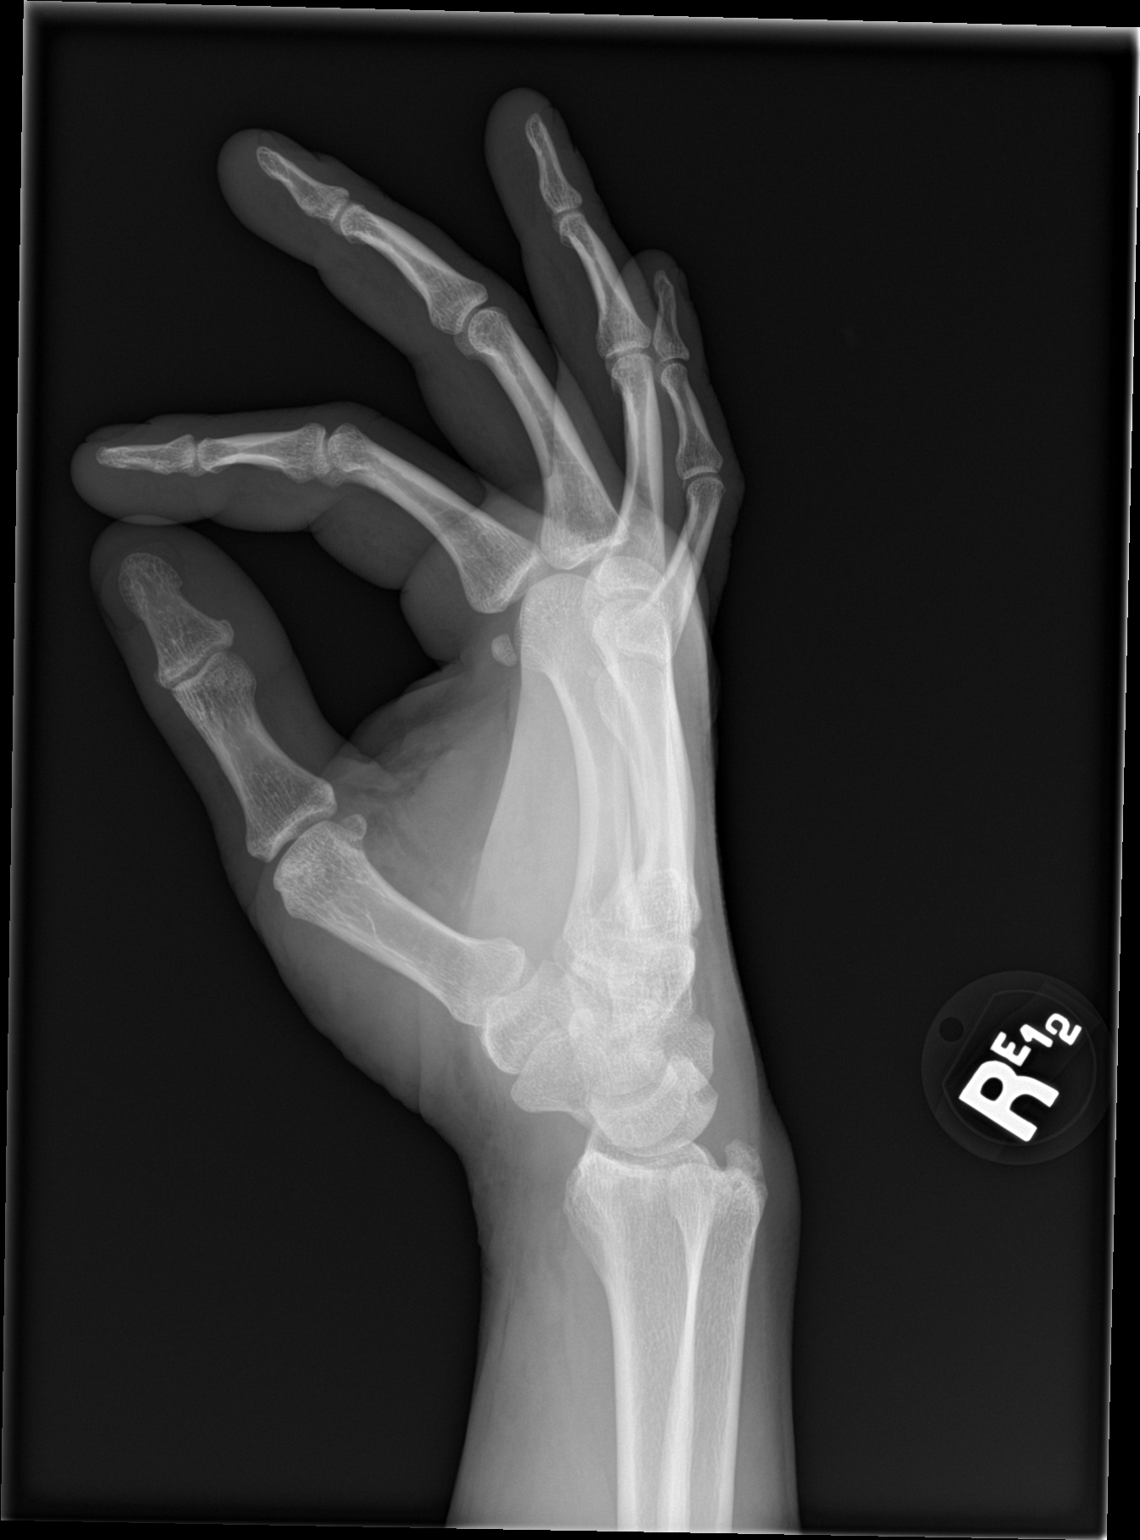

[3 of 3 positions shown; findings below may reference images not displayed]

FINDINGS: There is no evidence of fracture or dislocation. There is no
evidence of arthropathy or other focal bone abnormality. No
radiopaque foreign body. Soft tissue lacerations about the base of
the thumb.
IMPRESSION: No fracture or dislocation of the right hand. No radiopaque foreign
body.
